# Patient Record
Sex: Female | Born: 2000 | Race: White | Hispanic: No | Marital: Single | Smoking: Never smoker
Health system: Southern US, Community
[De-identification: ages and names within clinical notes are randomized; demographics above are authoritative.]

---
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
## 2018-03-07 ENCOUNTER — Encounter: Payer: Self-pay | Admitting: Pediatrics
